# Patient Record
Sex: Female | Born: 1955 | Race: White | Hispanic: No | Marital: Married
Health system: Southern US, Community
[De-identification: ages and names within clinical notes are randomized; demographics above are authoritative.]

---

## 1995-11-19 HISTORY — PX: AUGMENTATION MAMMAPLASTY: SUR837

## 2005-06-11 ENCOUNTER — Inpatient Hospital Stay: Payer: Self-pay

## 2005-07-16 ENCOUNTER — Ambulatory Visit: Payer: Self-pay

## 2006-05-09 ENCOUNTER — Ambulatory Visit: Payer: Self-pay

## 2007-06-08 ENCOUNTER — Ambulatory Visit: Payer: Self-pay

## 2007-06-19 ENCOUNTER — Ambulatory Visit: Payer: Self-pay

## 2008-07-01 ENCOUNTER — Ambulatory Visit: Payer: Self-pay

## 2008-11-16 ENCOUNTER — Ambulatory Visit: Payer: Self-pay

## 2009-07-21 ENCOUNTER — Ambulatory Visit: Payer: Self-pay

## 2010-08-15 ENCOUNTER — Ambulatory Visit: Payer: Self-pay

## 2011-09-27 ENCOUNTER — Ambulatory Visit: Payer: Self-pay

## 2011-10-04 ENCOUNTER — Ambulatory Visit: Payer: Self-pay

## 2012-04-09 ENCOUNTER — Ambulatory Visit: Payer: Self-pay

## 2012-06-29 ENCOUNTER — Ambulatory Visit: Payer: Self-pay | Admitting: Obstetrics & Gynecology

## 2012-06-29 LAB — CBC
HCT: 39.1 %
HGB: 13.3 g/dL
MCH: 29.9 pg
MCHC: 34 g/dL
MCV: 88 fL
Platelet: 223 x10 3/mm 3
RBC: 4.45 X10 6/mm 3
RDW: 13.1 %
WBC: 5.8 x10 3/mm 3

## 2012-07-02 ENCOUNTER — Ambulatory Visit: Payer: Self-pay | Admitting: Obstetrics & Gynecology

## 2012-07-03 LAB — HEMOGLOBIN: HGB: 11.5 g/dL — ABNORMAL LOW (ref 12.0–16.0)

## 2012-07-03 LAB — PATHOLOGY REPORT

## 2012-07-15 ENCOUNTER — Ambulatory Visit: Payer: Self-pay

## 2013-07-08 ENCOUNTER — Ambulatory Visit: Payer: Self-pay

## 2014-07-15 ENCOUNTER — Ambulatory Visit: Payer: Self-pay

## 2015-01-20 ENCOUNTER — Ambulatory Visit: Payer: Self-pay | Admitting: General Practice

## 2015-02-21 ENCOUNTER — Encounter: Payer: Self-pay | Admitting: *Deleted

## 2015-03-07 NOTE — Op Note (Signed)
PATIENT NAME:  Deborah Small, [name] MR#:  [id number] DATE OF BIRTH:  [birth year]  DATE OF PROCEDURE:  07/02/2012  PREOPERATIVE DIAGNOSIS: Menorrhagia and family history of ovarian cancer.   POSTOPERATIVE DIAGNOSIS: Menorrhagia and family history of ovarian cancer.   PROCEDURE: Total laparoscopic hysterectomy, bilateral salpingo-oophorectomy.   SURGEON:  Annamarie MajorPaul Tilia Faso, M.D.   ASSISTANT: Kate SablePhilip Rosenow, M.D.   ANESTHESIA: General.   ESTIMATED BLOOD LOSS: Minimal.   COMPLICATIONS: None.   FINDINGS: No adhesions, normal appendix.   DISPOSITION: To the recovery room in stable condition.   TECHNIQUE: The patient is prepped and draped in the usual sterile fashion after adequate anesthesia is obtained in the dorsal lithotomy position. Foley catheter is inserted. A V-Care device is placed on the cervix after the uterus is sounded to 7 cm. Uterine perforation is encountered with the sounding of the uterus.   Attention is then turned to the abdomen where a Veress needle is inserted through a 5-mm infraumbilical incision after Marcaine is used to anesthetize the skin. Veress needle placement is confirmed using the hanging drop technique and the abdomen is then insufflated with CO2 gas. A 5-mm trocar is then inserted under direct visualization with the laparoscope with no injuries or bleeding noted. The above-mentioned findings are visualized. The patient is placed in Trendelenburg positioning.   An 11-mm trocar is placed in the right lower quadrant and a 5-mm trocar is placed in the left lower quadrant lateral to the inferior epigastric blood vessels with no injuries or bleeding noted. The uterus is manipulated with the V-Care device as well as the tenaculum. The infundibulopelvic blood vessels and ligaments on each side are carefully coagulated using the bipolar cautery device after the ureters are identified and out of harm's way. The adnexa is then dissected using the 5-mm Harmonic scalpel. The round  ligaments are carefully coagulated and cut. The dissection is carried down to the level of the uterine arteries which are carefully coagulated with the bipolar cautery device and then transected.  The V-Care device is identified through the tissues at the cervicovaginal junction and the circumference is dissected with the Harmonic scalpel until complete amputation of cervix, uterus, and adnexa is accomplished. The uterus is then removed and a sponge is placed in the vagina to maintain pneumoperitoneum.   The vaginal cuff is closed with interrupted 1-0 Polysorb sutures using the Endo Stitch device. Vaginal examination reveals complete closure. Irrigation of the pelvic cavity with aspiration of fluid is performed and excellent hemostasis is visualized. The patient is leveled. Gas is expelled and incisions are closed with Dermabond. The patient goes to the recovery room in stable condition. All sponge, instrument, and needle counts are correct.    ____________________________ R. Annamarie MajorPaul Destanee Bedonie, MD [id number] D: 07/02/2012 09:07:36 ET T: 07/02/2012 10:03:42 ET JOB#: [id number]  cc: Dierdre Searles. Paul Kirklin Mcduffee, MD, <Dictator> Nadara MustardOBERT P Emeline Simpson MD ELECTRONICALLY SIGNED 07/02/2012 14:34

## 2015-07-14 ENCOUNTER — Other Ambulatory Visit: Payer: Self-pay | Admitting: Unknown Physician Specialty

## 2015-07-14 DIAGNOSIS — Z1231 Encounter for screening mammogram for malignant neoplasm of breast: Secondary | ICD-10-CM

## 2015-07-19 ENCOUNTER — Ambulatory Visit
Admission: RE | Admit: 2015-07-19 | Discharge: 2015-07-19 | Disposition: A | Discharge status: Home / Self Care | Payer: BC Managed Care – PPO | Source: Ambulatory Visit | Attending: Unknown Physician Specialty | Admitting: Unknown Physician Specialty

## 2015-07-19 ENCOUNTER — Other Ambulatory Visit: Payer: Self-pay | Admitting: Unknown Physician Specialty

## 2015-07-19 DIAGNOSIS — Z1231 Encounter for screening mammogram for malignant neoplasm of breast: Secondary | ICD-10-CM | POA: Insufficient documentation

## 2016-06-24 ENCOUNTER — Other Ambulatory Visit: Payer: Self-pay | Admitting: Family Medicine

## 2016-06-24 DIAGNOSIS — Z1231 Encounter for screening mammogram for malignant neoplasm of breast: Secondary | ICD-10-CM

## 2016-07-19 ENCOUNTER — Other Ambulatory Visit: Payer: Self-pay | Admitting: Family Medicine

## 2016-07-19 ENCOUNTER — Ambulatory Visit
Admission: RE | Admit: 2016-07-19 | Discharge: 2016-07-19 | Disposition: A | Discharge status: Home / Self Care | Payer: BC Managed Care – PPO | Source: Ambulatory Visit | Attending: Family Medicine | Admitting: Family Medicine

## 2016-07-19 DIAGNOSIS — Z1231 Encounter for screening mammogram for malignant neoplasm of breast: Secondary | ICD-10-CM

## 2017-07-09 ENCOUNTER — Other Ambulatory Visit: Payer: Self-pay | Admitting: Family Medicine

## 2017-07-09 DIAGNOSIS — Z1231 Encounter for screening mammogram for malignant neoplasm of breast: Secondary | ICD-10-CM

## 2017-07-25 ENCOUNTER — Ambulatory Visit
Admission: RE | Admit: 2017-07-25 | Discharge: 2017-07-25 | Disposition: A | Discharge status: Home / Self Care | Payer: BC Managed Care – PPO | Source: Ambulatory Visit | Attending: Family Medicine | Admitting: Family Medicine

## 2017-07-25 DIAGNOSIS — Z1231 Encounter for screening mammogram for malignant neoplasm of breast: Secondary | ICD-10-CM | POA: Diagnosis present

## 2018-07-29 ENCOUNTER — Other Ambulatory Visit: Payer: Self-pay | Admitting: Family Medicine

## 2018-07-29 DIAGNOSIS — Z1231 Encounter for screening mammogram for malignant neoplasm of breast: Secondary | ICD-10-CM

## 2018-08-07 ENCOUNTER — Ambulatory Visit
Admission: RE | Admit: 2018-08-07 | Discharge: 2018-08-07 | Disposition: A | Discharge status: Home / Self Care | Payer: BC Managed Care – PPO | Source: Ambulatory Visit | Attending: Family Medicine | Admitting: Family Medicine

## 2018-08-07 DIAGNOSIS — Z1231 Encounter for screening mammogram for malignant neoplasm of breast: Secondary | ICD-10-CM | POA: Insufficient documentation

## 2019-07-27 ENCOUNTER — Other Ambulatory Visit: Payer: Self-pay | Admitting: Family Medicine

## 2019-07-27 DIAGNOSIS — Z1231 Encounter for screening mammogram for malignant neoplasm of breast: Secondary | ICD-10-CM

## 2019-09-03 ENCOUNTER — Ambulatory Visit
Admission: RE | Admit: 2019-09-03 | Discharge: 2019-09-03 | Disposition: A | Discharge status: Home / Self Care | Payer: BC Managed Care – PPO | Source: Ambulatory Visit | Attending: Family Medicine | Admitting: Family Medicine

## 2019-09-03 DIAGNOSIS — Z1231 Encounter for screening mammogram for malignant neoplasm of breast: Secondary | ICD-10-CM | POA: Insufficient documentation

## 2020-08-16 ENCOUNTER — Other Ambulatory Visit: Payer: Self-pay | Admitting: Family Medicine

## 2020-08-17 ENCOUNTER — Other Ambulatory Visit: Payer: Self-pay | Admitting: Family Medicine

## 2020-08-17 DIAGNOSIS — Z1231 Encounter for screening mammogram for malignant neoplasm of breast: Secondary | ICD-10-CM

## 2020-09-15 ENCOUNTER — Ambulatory Visit
Admission: RE | Admit: 2020-09-15 | Discharge: 2020-09-15 | Disposition: A | Discharge status: Home / Self Care | Payer: BC Managed Care – PPO | Source: Ambulatory Visit | Attending: Family Medicine | Admitting: Family Medicine

## 2020-09-15 ENCOUNTER — Other Ambulatory Visit: Payer: Self-pay

## 2020-09-15 DIAGNOSIS — Z1231 Encounter for screening mammogram for malignant neoplasm of breast: Secondary | ICD-10-CM

## 2021-08-31 ENCOUNTER — Other Ambulatory Visit: Payer: Self-pay | Admitting: Family Medicine

## 2021-08-31 DIAGNOSIS — Z1231 Encounter for screening mammogram for malignant neoplasm of breast: Secondary | ICD-10-CM

## 2021-09-21 ENCOUNTER — Other Ambulatory Visit: Payer: Self-pay

## 2021-09-21 ENCOUNTER — Ambulatory Visit
Admission: RE | Admit: 2021-09-21 | Discharge: 2021-09-21 | Disposition: A | Discharge status: Home / Self Care | Payer: BC Managed Care – PPO | Source: Ambulatory Visit | Attending: Family Medicine | Admitting: Family Medicine

## 2021-09-21 DIAGNOSIS — Z1231 Encounter for screening mammogram for malignant neoplasm of breast: Secondary | ICD-10-CM | POA: Insufficient documentation

## 2022-01-09 IMAGING — MG DIGITAL SCREENING BREAST BILAT IMPLANT W/ TOMO W/ CAD
9 of 12 series · 9 of 28 positions shown · non-contrast
Comparison: Previous exam(s).

CLINICAL DATA: Screening.

EXAM:
DIGITAL SCREENING BILATERAL MAMMOGRAM WITH IMPLANTS, CAD AND
TOMOSYNTHESIS
TECHNIQUE: Bilateral screening digital craniocaudal and mediolateral oblique
mammograms were obtained. Bilateral screening digital breast
tomosynthesis was performed. The images were evaluated with
computer-aided detection. Standard and/or implant displaced views
were performed.

[L MLO]
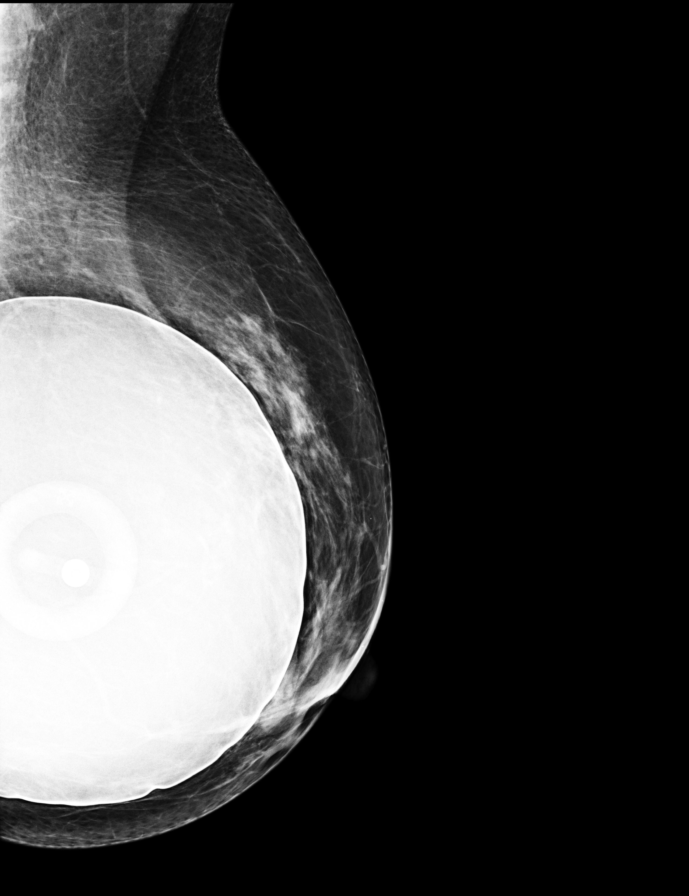

[R MLO]
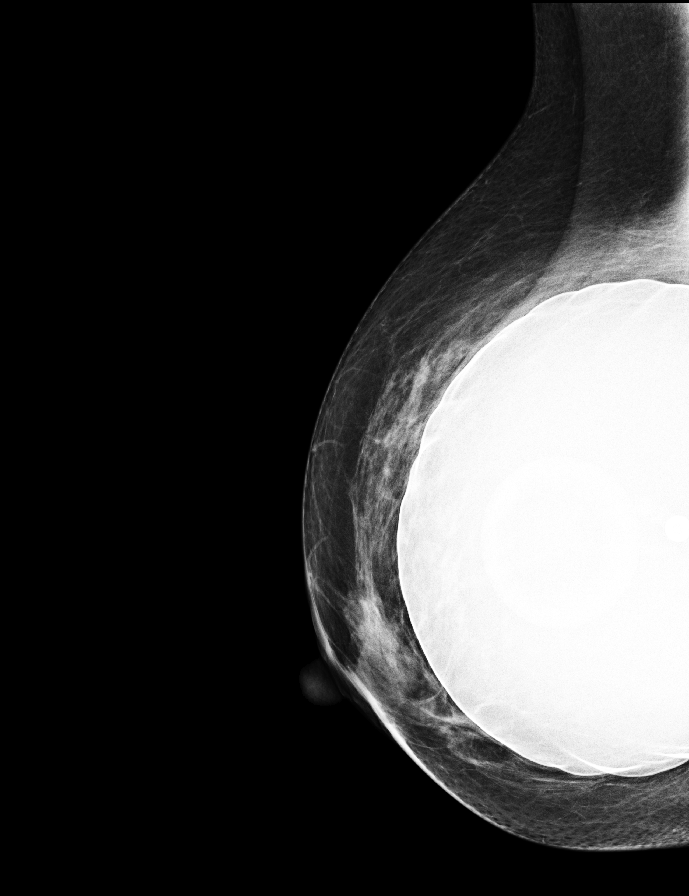

[L CC]
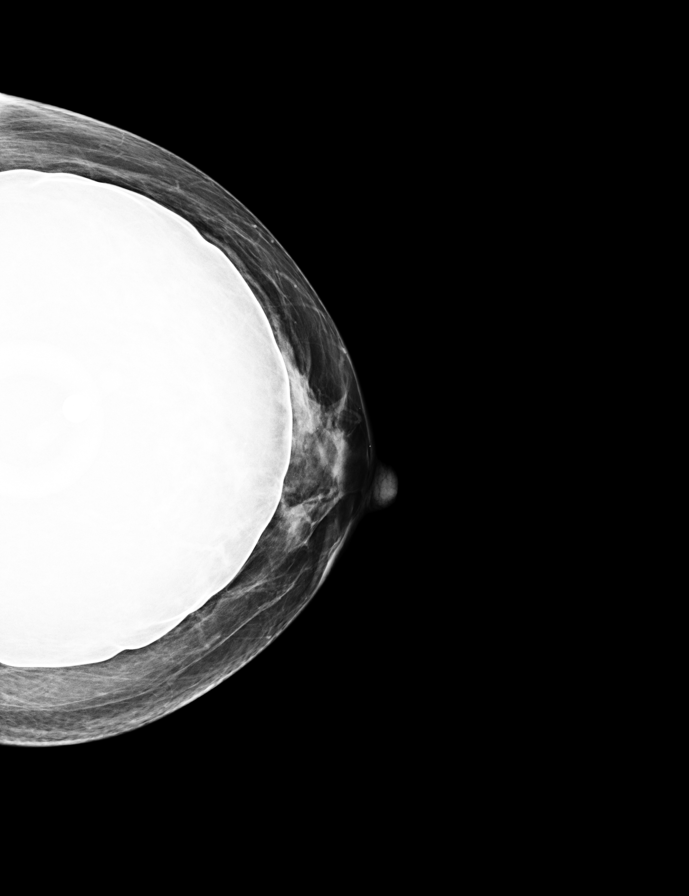

[R CC]
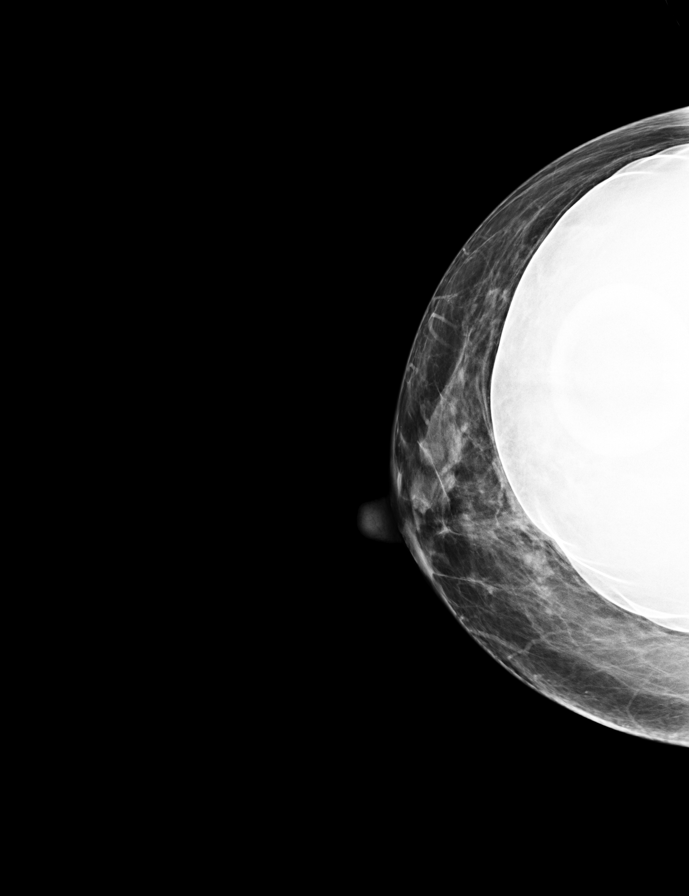

[R MLO synth-2D]
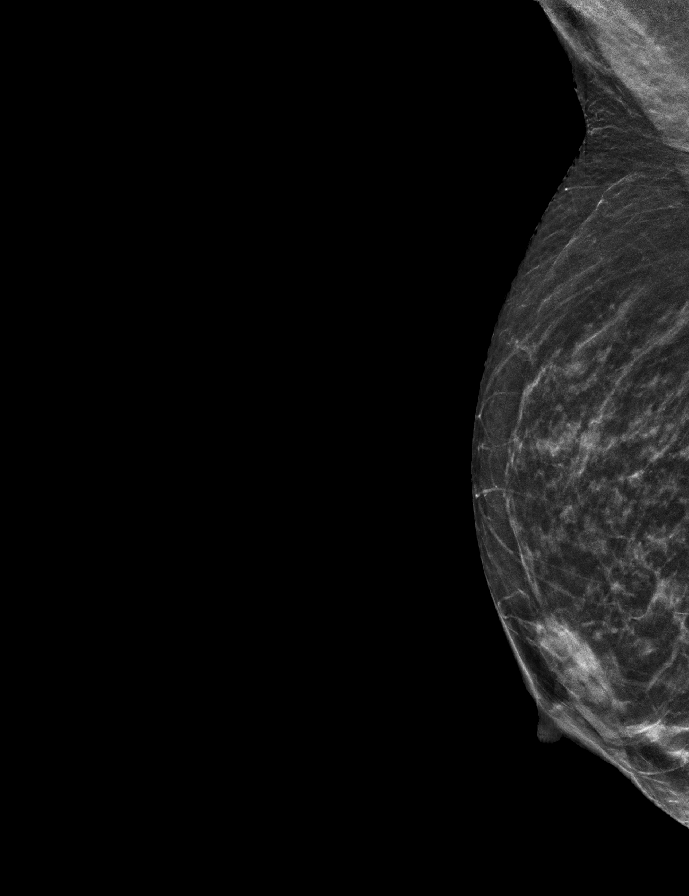

[L MLO synth-2D]
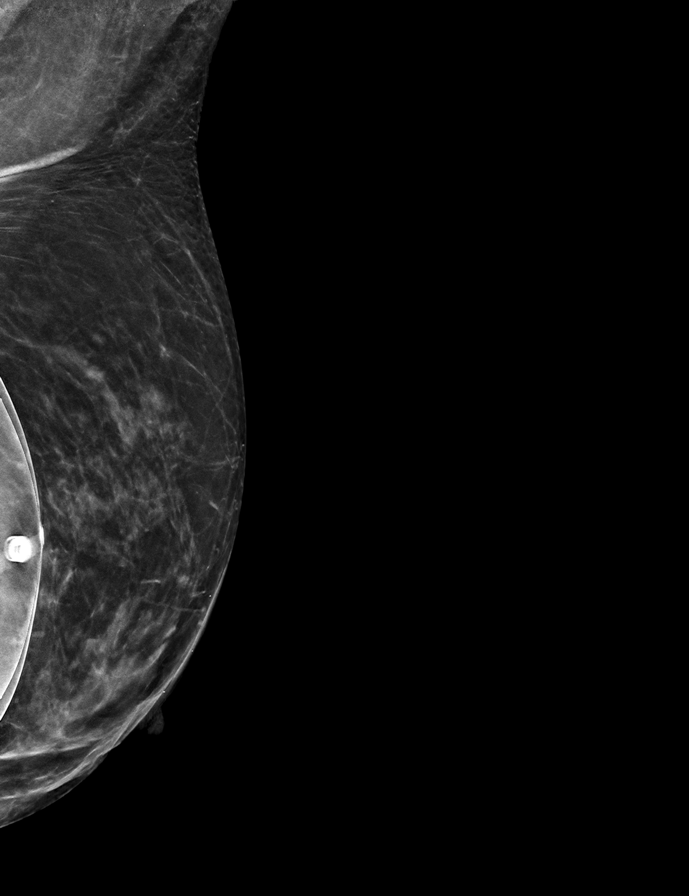

[R CC synth-2D]
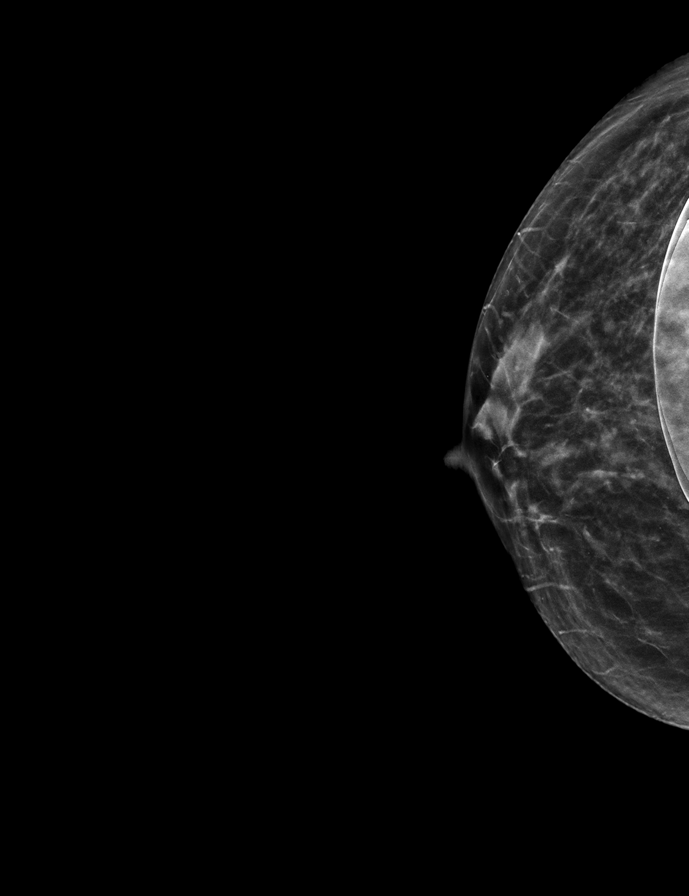

[L CC synth-2D]
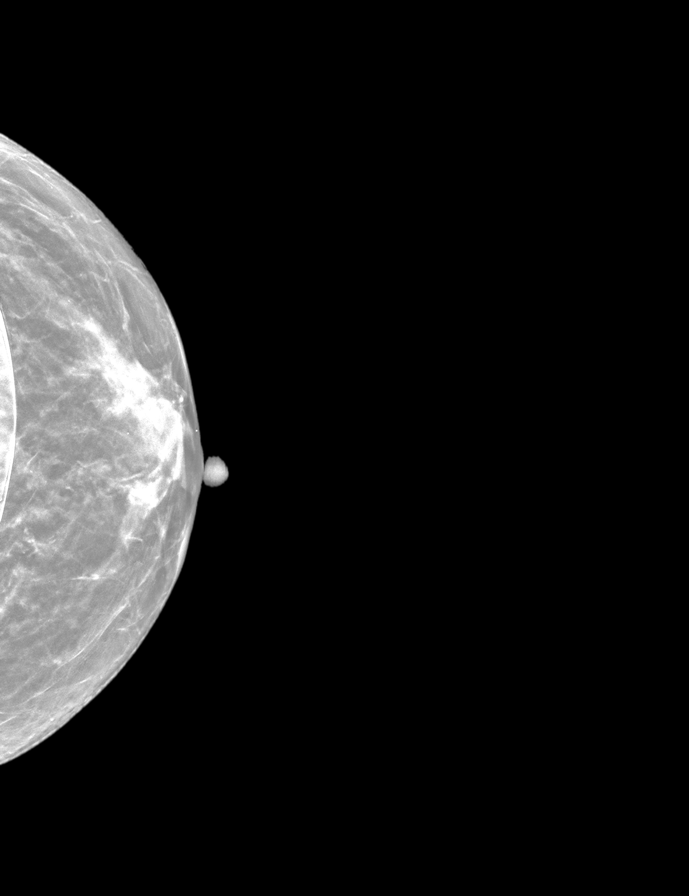

[R CC tomo · tomo slice 25/49.0]
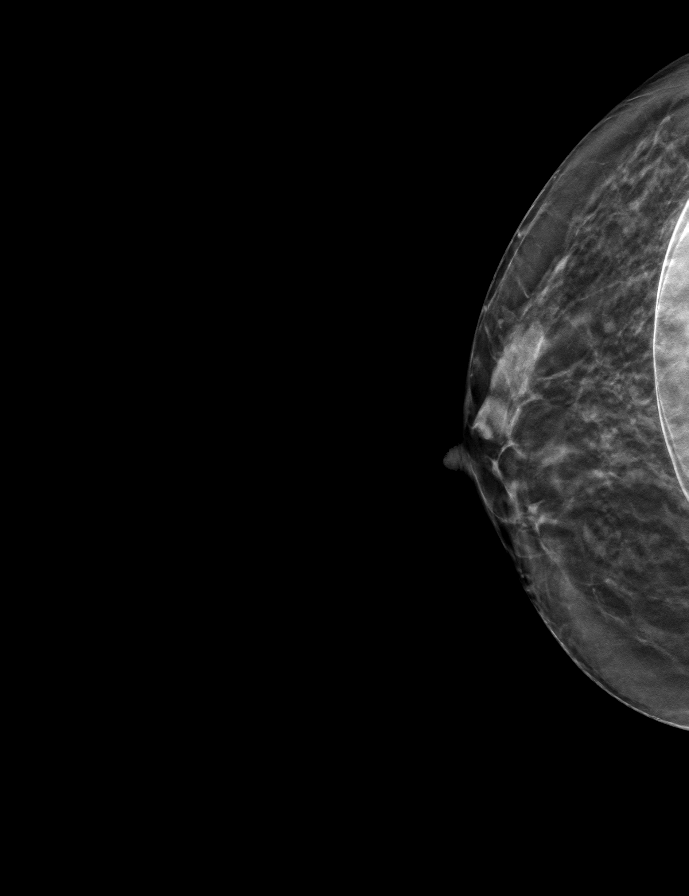

[9 of 28 positions shown; findings below may reference images not displayed]

ACR Breast Density Category c: The breast tissue is heterogeneously
dense, which may obscure small masses.
FINDINGS: The patient has retropectoral implants. There are no findings
suspicious for malignancy.
IMPRESSION: No mammographic evidence of malignancy. A result letter of this
screening mammogram will be mailed directly to the patient.

RECOMMENDATION:
Screening mammogram in one year. (Code:LT-E-7TH)

BI-RADS CATEGORY  1:  Negative.

## 2022-09-24 ENCOUNTER — Other Ambulatory Visit: Payer: Self-pay | Admitting: Family Medicine

## 2022-09-24 DIAGNOSIS — Z1231 Encounter for screening mammogram for malignant neoplasm of breast: Secondary | ICD-10-CM

## 2022-10-15 ENCOUNTER — Ambulatory Visit: Payer: BC Managed Care – PPO
